# Patient Record
Sex: Male | Born: 1935 | Race: White | Hispanic: No
Health system: Southern US, Community
[De-identification: ages and names within clinical notes are randomized; demographics above are authoritative.]

---

## 2004-10-07 ENCOUNTER — Ambulatory Visit: Payer: Self-pay | Admitting: Family Medicine

## 2005-03-12 ENCOUNTER — Other Ambulatory Visit: Payer: Self-pay

## 2005-03-12 ENCOUNTER — Emergency Department: Payer: Self-pay | Admitting: Emergency Medicine

## 2005-05-05 ENCOUNTER — Emergency Department: Payer: Self-pay | Admitting: Emergency Medicine

## 2006-03-17 ENCOUNTER — Emergency Department: Payer: Self-pay | Admitting: Unknown Physician Specialty

## 2006-03-23 ENCOUNTER — Ambulatory Visit: Payer: Self-pay | Admitting: Family Medicine

## 2006-05-21 ENCOUNTER — Emergency Department: Payer: Self-pay | Admitting: Emergency Medicine

## 2006-07-10 IMAGING — CR PELVIS - 1-2 VIEW
1 series · 2 of 2 positions shown · non-contrast
Comparison: none

REASON FOR EXAM: Fall, pain
COMMENTS:  LMP: (Male)

PROCEDURE:     DXR - DXR PELVIS AP ONLY  - May 05, 2005  [DATE]
RESULT:          AP view reveals the bones to be osteoporotic.  Again noted
is the intertrochanteric hip fracture on the RIGHT.  An ostomy bag is
present in the LEFT pelvis.

[Series 1: view not recorded · 0.17mm/px · 2 of 2 slices shown]
[im 1/2]
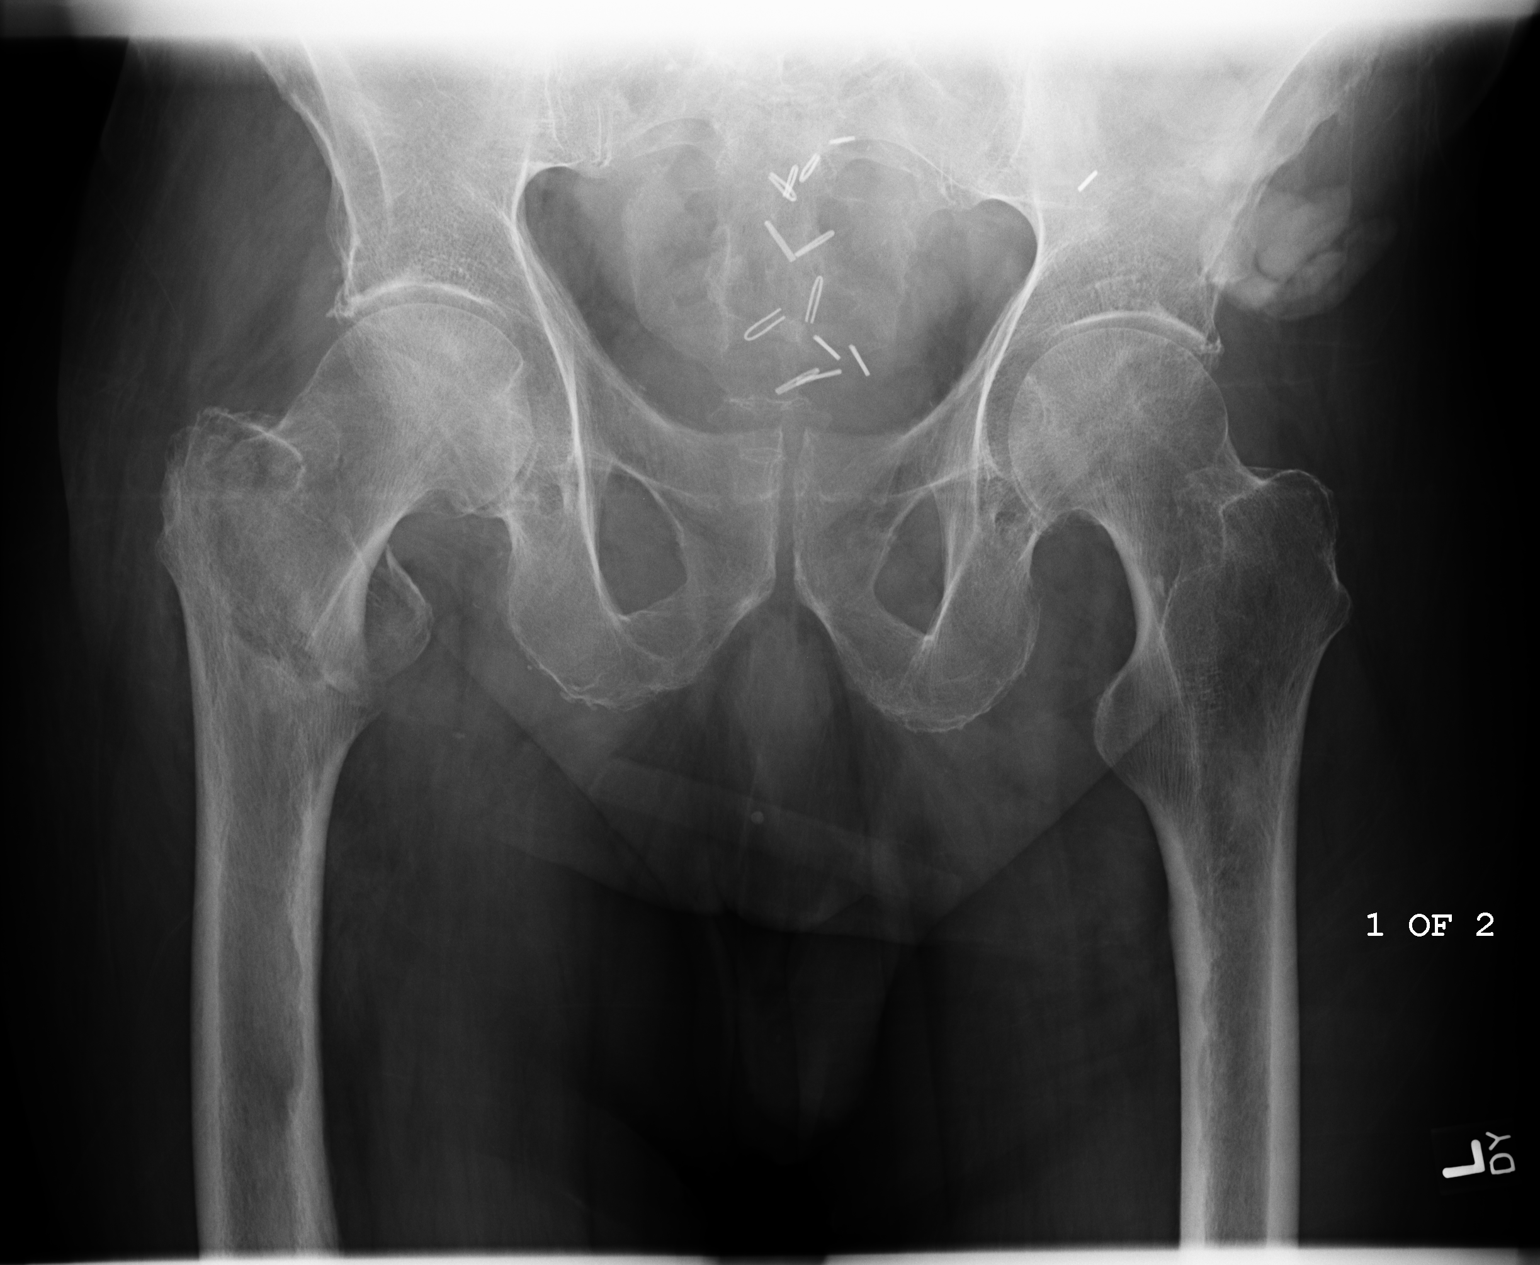
[im 2/2]
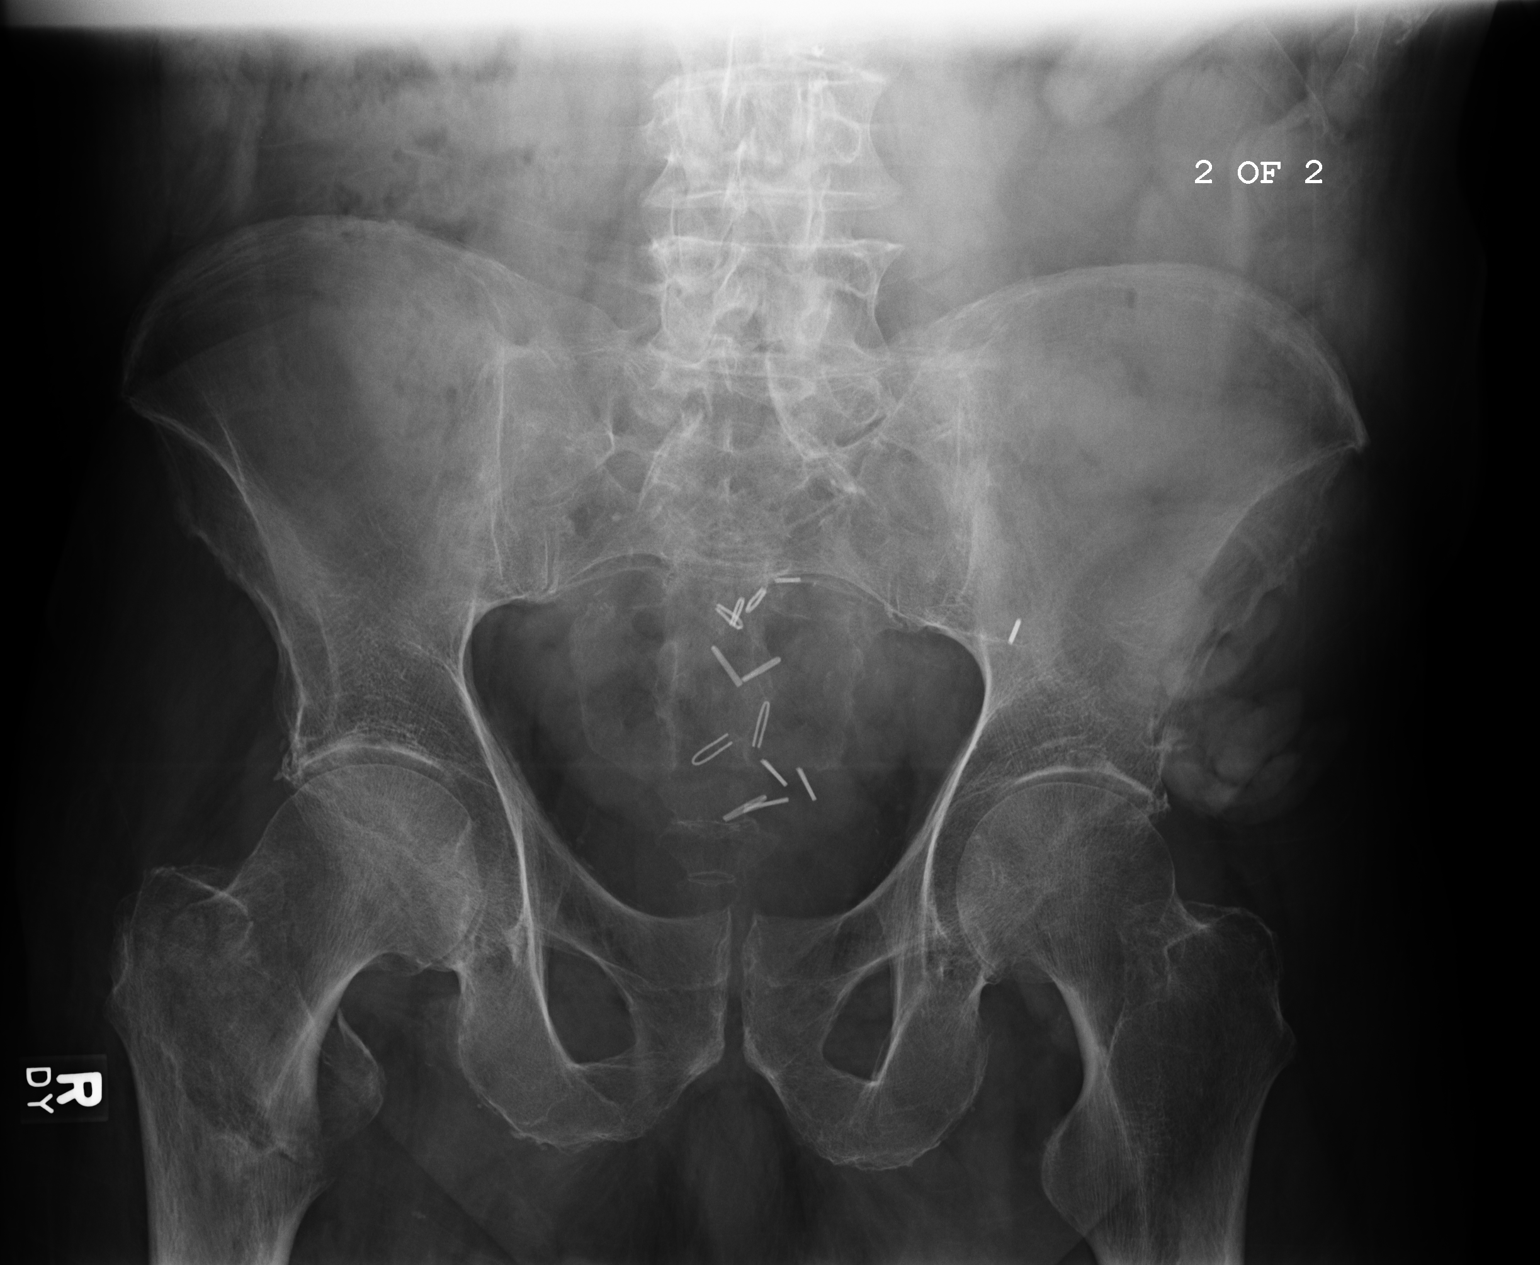

[2 of 2 positions shown; findings below may reference images not displayed]

IMPRESSION: No additional fracture is noted.

## 2007-07-27 IMAGING — CR DG CHEST 1V PORT
1 series · 1 of 1 positions shown · non-contrast
Comparison: none

REASON FOR EXAM: fever
COMMENTS:

[view not recorded]
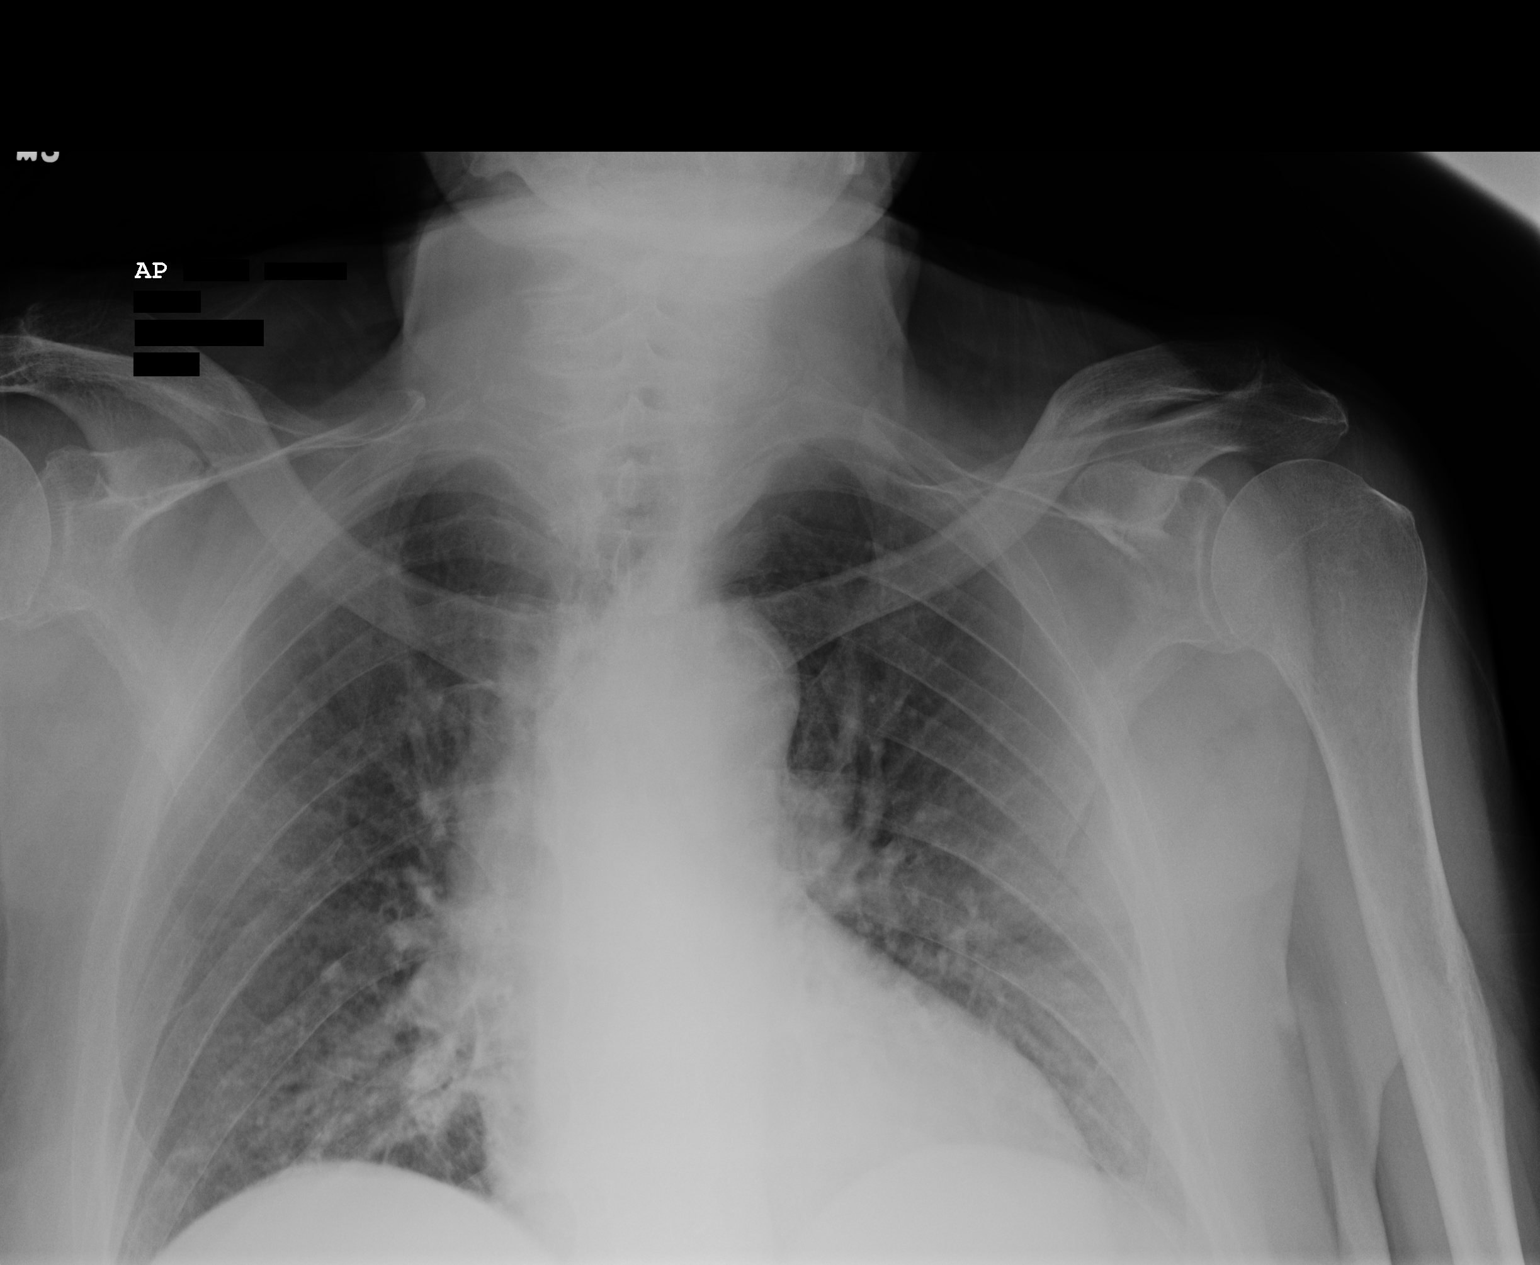

[1 of 1 positions shown; findings below may reference images not displayed]

PROCEDURE:     DXR - DXR PORTABLE CHEST SINGLE VIEW  - May 22, 2006 [DATE]

RESULT:     Portable view of the chest is compared to the prior study of
05-05-05.  Today's exam show shallow inspiration with pulmonary vascular
congestion.  There is no focal consolidation. There is no effusion. There is
no pneumothorax.
IMPRESSION: 1)Pulmonary vascular congestion, stable since the prior exam.

2)Borderline cardiomegaly.

## 2016-12-21 ENCOUNTER — Telehealth: Payer: Self-pay | Admitting: *Deleted

## 2016-12-21 NOTE — Telephone Encounter (Signed)
Note opened in error/erroneous encounter.

## 2016-12-21 NOTE — Telephone Encounter (Deleted)
Dr[facility] Dian QueenHassett from [facility].  He presented to ED yesterday, now inpatient, presenting rhythm appeared VT but PPM interrogation from yesterday raises question of possible SVT.  Dr. Dian QueenHassett would like an EP to review strips if possible.  Advised I will get in touch with Joey, [employer] rep, and with Dr. Elberta Fortisamnitz at the hospital.  Dr. Dian QueenHassett requests call back at her direct phone number.

## 2016-12-21 NOTE — Telephone Encounter (Deleted)
Strips to be brought to Dr. Elberta Fortisamnitz for review by [employer] rep.
